# Patient Record
Sex: Male | Born: 1995 | Race: White | Hispanic: No | Smoking: Never smoker
Health system: Southern US, Community
[De-identification: ages and names within clinical notes are randomized; demographics above are authoritative.]

## PROBLEM LIST (undated history)

## (undated) DIAGNOSIS — J45909 Unspecified asthma, uncomplicated: Secondary | ICD-10-CM

---

## 2014-08-26 ENCOUNTER — Emergency Department: Payer: Self-pay | Admitting: Emergency Medicine

## 2014-08-29 LAB — BETA STREP CULTURE(ARMC)

## 2015-08-28 ENCOUNTER — Encounter: Payer: Self-pay | Admitting: Emergency Medicine

## 2015-08-28 ENCOUNTER — Emergency Department
Admission: EM | Admit: 2015-08-28 | Discharge: 2015-08-29 | Disposition: A | Discharge status: Home / Self Care | Payer: BLUE CROSS/BLUE SHIELD | Attending: Emergency Medicine | Admitting: Emergency Medicine

## 2015-08-28 ENCOUNTER — Emergency Department: Payer: BLUE CROSS/BLUE SHIELD

## 2015-08-28 DIAGNOSIS — J45901 Unspecified asthma with (acute) exacerbation: Secondary | ICD-10-CM | POA: Insufficient documentation

## 2015-08-28 DIAGNOSIS — R05 Cough: Secondary | ICD-10-CM | POA: Diagnosis present

## 2015-08-28 DIAGNOSIS — J069 Acute upper respiratory infection, unspecified: Secondary | ICD-10-CM | POA: Insufficient documentation

## 2015-08-28 HISTORY — DX: Unspecified asthma, uncomplicated: J45.909

## 2015-08-28 MED ORDER — IPRATROPIUM-ALBUTEROL 0.5-2.5 (3) MG/3ML IN SOLN
3.0000 mL | Freq: Once | RESPIRATORY_TRACT | Status: AC
  Administered 2015-08-28: 3 mL via RESPIRATORY_TRACT
  Filled 2015-08-28: qty 3

## 2015-08-28 NOTE — ED Notes (Signed)
Patient ambulatory to triage with steady gait, without difficulty or distress noted; pt reports x 2 days having nonprod cough and sinus congestion; st out of his "blue inhaler" and feels as if his asthma is "acting up"

## 2015-08-28 NOTE — ED Notes (Signed)
Pt seen and assessed by provider, see providers notes for full details.  

## 2015-08-28 NOTE — ED Provider Notes (Signed)
[facility] Emergency Department Provider Note ____________________________________________  Time seen: 2240  I have reviewed the triage vital signs and the nursing notes.  HISTORY  Chief Complaint  Cough  HPI Steven Conner is a 19 y.o. male reports to the ED for evaluation and management of 2 days of nonproductive cough. He reports sinus congestion and the same 2 days. He denies any fevers, chills, or sweats. He also is reporting some increased chest tightness due to his asthma. He's been out of his inhaler for some time.  Past Medical History  Diagnosis Date  . Asthma     There are no active problems to display for this patient.  History reviewed. No pertinent past surgical history.  Current Outpatient Rx  Name  Route  Sig  Dispense  Refill  . albuterol (PROVENTIL HFA;VENTOLIN HFA) 108 (90 BASE) MCG/ACT inhaler   Inhalation   Inhale 2 puffs into the lungs every 6 (six) hours as needed for wheezing or shortness of breath.   1 Inhaler   0   . benzonatate (TESSALON PERLES) 100 MG capsule   Oral   Take 1 capsule (100 mg total) by mouth 3 (three) times daily as needed for cough (Take 1-2 per dose).   30 capsule   0    Allergies Review of patient's allergies indicates no known allergies.  No family history on file.  Social History Social History  Substance Use Topics  . Smoking status: Never Smoker   . Smokeless tobacco: None  . Alcohol Use: No   Review of Systems  Constitutional: Negative for fever. Eyes: Negative for visual changes. ENT: Negative for sore throat. Cardiovascular: Negative for chest pain. Respiratory: Negative for shortness of breath. Reports cough Gastrointestinal: Negative for abdominal pain, vomiting and diarrhea. Genitourinary: Negative for dysuria. Musculoskeletal: Negative for back pain. Skin: Negative for rash. Neurological: Negative for headaches, focal weakness or  numbness. ____________________________________________  PHYSICAL EXAM:  VITAL SIGNS: ED Triage Vitals  Enc Vitals Group     BP 08/28/15 2223 142/88 mmHg     Pulse Rate 08/28/15 2223 100     Resp 08/28/15 2223 18     Temp 08/28/15 2223 99 F (37.2 C)     Temp Source 08/28/15 2223 Oral     SpO2 08/28/15 2223 99 %     Weight 08/28/15 2223 170 lb (77.111 kg)     Height 08/28/15 2223 5\' 10"  (1.778 m)     Head Cir --      Peak Flow --      Pain Score --      Pain Loc --      Pain Edu? --      Excl. in GC? --    Constitutional: Alert and oriented. Well appearing and in no distress. Head: Normocephalic and atraumatic.      Eyes: Conjunctivae are normal. PERRL. Normal extraocular movements      Ears: Canals clear. TMs intact bilaterally.   Nose: No congestion/rhinorrhea.   Mouth/Throat: Mucous membranes are moist.   Neck: Supple. No thyromegaly. Hematological/Lymphatic/Immunological: No cervical lymphadenopathy. Cardiovascular: Normal rate, regular rhythm.  Respiratory: Normal respiratory effort. No wheezes/rales/rhonchi. Gastrointestinal: Soft and nontender. No distention. Musculoskeletal: Nontender with normal range of motion in all extremities.  Neurologic:  Normal gait without ataxia. Normal speech and language. No gross focal neurologic deficits are appreciated. Skin:  Skin is warm, dry and intact. No rash noted. Psychiatric: Mood and affect are normal. Patient exhibits appropriate insight and judgment. ____________________________________________  RADIOLOGY CXR IMPRESSION: No active cardiopulmonary disease.  I, Dominigue Gellner, Charlesetta Ivory, personally viewed and evaluated these images (plain radiographs) as part of my medical decision making.  ____________________________________________  PROCEDURES  Duoneb x 1 ____________________________________________  INITIAL IMPRESSION / ASSESSMENT AND PLAN / ED COURSE  Mild asthma flare likely due to symptoms change  without indication of pulmonary process on x-ray. Patient's symptoms improved following the DuoNeb. He will be discharged with albuterol inhaler and Tessalon Perles for symptomatic relief. He is encouraged to hydrate and continue monitor symptoms. He'll return to the ED for acutely worsening symptoms. ____________________________________________  FINAL CLINICAL IMPRESSION(S) / ED DIAGNOSES  Final diagnoses:  URI (upper respiratory infection)  Asthma exacerbation        Lissa Hoard, PA-C 08/29/15 0009  Emily Filbert, MD 09/03/15 [phone]

## 2015-08-29 MED ORDER — ALBUTEROL SULFATE HFA 108 (90 BASE) MCG/ACT IN AERS: 2.0000 | INHALATION_SPRAY | Freq: Four times a day (QID) | RESPIRATORY_TRACT | Status: AC | PRN

## 2015-08-29 MED ORDER — BENZONATATE 100 MG PO CAPS: 100.0000 mg | ORAL_CAPSULE | Freq: Three times a day (TID) | ORAL | Status: AC | PRN

## 2015-08-29 NOTE — Discharge Instructions (Signed)
Asthma, Acute Bronchospasm °Acute bronchospasm caused by asthma is also referred to as an asthma attack. Bronchospasm means your air passages become narrowed. The narrowing is caused by inflammation and tightening of the muscles in the air tubes (bronchi) in your lungs. This can make it hard to breathe or cause you to wheeze and cough. °CAUSES °Possible triggers are: °· Animal dander from the skin, hair, or feathers of animals. °· Dust mites contained in house dust. °· Cockroaches. °· Pollen from trees or grass. °· Mold. °· Cigarette or tobacco smoke. °· Air pollutants such as dust, household cleaners, hair sprays, aerosol sprays, paint fumes, strong chemicals, or strong odors. °· Cold air or weather changes. Cold air may trigger inflammation. Winds increase molds and pollens in the air. °· Strong emotions such as crying or laughing hard. °· Stress. °· Certain medicines such as aspirin or beta-blockers. °· Sulfites in foods and drinks, such as dried fruits and wine. °· Infections or inflammatory conditions, such as a flu, cold, or inflammation of the nasal membranes (rhinitis). °· Gastroesophageal reflux disease (GERD). GERD is a condition where stomach acid backs up into your esophagus. °· Exercise or strenuous activity. °SIGNS AND SYMPTOMS  °· Wheezing. °· Excessive coughing, particularly at night. °· Chest tightness. °· Shortness of breath. °DIAGNOSIS  °Your health care provider will ask you about your medical history and perform a physical exam. A chest X-ray or blood testing may be performed to look for other causes of your symptoms or other conditions that may have triggered your asthma attack.  °TREATMENT  °Treatment is aimed at reducing inflammation and opening up the airways in your lungs.  Most asthma attacks are treated with inhaled medicines. These include quick relief or rescue medicines (such as bronchodilators) and controller medicines (such as inhaled corticosteroids). These medicines are sometimes  given through an inhaler or a nebulizer. Systemic steroid medicine taken by mouth or given through an IV tube also can be used to reduce the inflammation when an attack is moderate or severe. Antibiotic medicines are only used if a bacterial infection is present.  °HOME CARE INSTRUCTIONS  °· Rest. °· Drink plenty of liquids. This helps the mucus to remain thin and be easily coughed up. Only use caffeine in moderation and do not use alcohol until you have recovered from your illness. °· Do not smoke. Avoid being exposed to secondhand smoke. °· You play a critical role in keeping yourself in good health. Avoid exposure to things that cause you to wheeze or to have breathing problems. °· Keep your medicines up-to-date and available. Carefully follow your health care provider's treatment plan. °· Take your medicine exactly as prescribed. °· When pollen or pollution is bad, keep windows closed and use an air conditioner or go to places with air conditioning. °· Asthma requires careful medical care. See your health care provider for a follow-up as advised. If you are more than [redacted] weeks pregnant and you were prescribed any new medicines, let your obstetrician know about the visit and how you are doing. Follow up with your health care provider as directed. °· After you have recovered from your asthma attack, make an appointment with your outpatient doctor to talk about ways to reduce the likelihood of future attacks. If you do not have a doctor who manages your asthma, make an appointment with a primary care doctor to discuss your asthma. °SEEK IMMEDIATE MEDICAL CARE IF:  °· You are getting worse. °· You have trouble breathing. If severe, call your local   emergency services (911 in the [place]  You develop chest pain or discomfort.  You are vomiting.  You are not able to keep fluids down.  You are coughing up yellow, green, brown, or bloody sputum.  You have a fever and your symptoms suddenly get worse.  You have  trouble swallowing. MAKE SURE YOU:   Understand these instructions.  Will watch your condition.  Will get help right away if you are not doing well or get worse.   This information is not intended to replace advice given to you by your health care provider. Make sure you discuss any questions you have with your health care provider.   Document Released: 01/28/2007 Document Revised: 10/18/2013 Document Reviewed: 04/20/2013 Elsevier Interactive Patient Education 2016 [employer]  Upper Respiratory Infection, Adult Most upper respiratory infections (URIs) are caused by a virus. A URI affects the nose, throat, and upper air passages. The most common type of URI is often called "the common cold." HOME CARE   Take medicines only as told by your doctor.  Gargle warm saltwater or take cough drops to comfort your throat as told by your doctor.  Use a warm mist humidifier or inhale steam from a shower to increase air moisture. This may make it easier to breathe.  Drink enough fluid to keep your pee (urine) clear or pale yellow.  Eat soups and other clear broths.  Have a healthy diet.  Rest as needed.  Go back to work when your fever is gone or your doctor says it is okay.  You may need to stay home longer to avoid giving your URI to others.  You can also wear a face mask and wash your hands often to prevent spread of the virus.  Use your inhaler more if you have asthma.  Do not use any tobacco products, including cigarettes, chewing tobacco, or electronic cigarettes. If you need help quitting, ask your doctor. GET HELP IF:  You are getting worse, not better.  Your symptoms are not helped by medicine.  You have chills.  You are getting more short of breath.  You have brown or red mucus.  You have yellow or brown discharge from your nose.  You have pain in your face, especially when you bend forward.  You have a fever.  You have puffy (swollen) neck glands.  You  have pain while swallowing.  You have white areas in the back of your throat. GET HELP RIGHT AWAY IF:   You have very bad or constant:  Headache.  Ear pain.  Pain in your forehead, behind your eyes, and over your cheekbones (sinus pain).  Chest pain.  You have long-lasting (chronic) lung disease and any of the following:  Wheezing.  Long-lasting cough.  Coughing up blood.  A change in your usual mucus.  You have a stiff neck.  You have changes in your:  Vision.  Hearing.  Thinking.  Mood. MAKE SURE YOU:   Understand these instructions.  Will watch your condition.  Will get help right away if you are not doing well or get worse.   This information is not intended to replace advice given to you by your health care provider. Make sure you discuss any questions you have with your health care provider.   Document Released: 03/31/2008 Document Revised: 02/27/2015 Document Reviewed: 01/18/2014 Elsevier Interactive Patient Education 2016 [employer].  Continue to monitory symptoms. Follow-up with your provider or [facility] as needed.  Take the prescription meds as  directed. Return for worsening symptoms.

## 2015-11-12 ENCOUNTER — Emergency Department: Payer: BLUE CROSS/BLUE SHIELD

## 2015-11-12 ENCOUNTER — Encounter: Payer: Self-pay | Admitting: Emergency Medicine

## 2015-11-12 DIAGNOSIS — R079 Chest pain, unspecified: Secondary | ICD-10-CM | POA: Insufficient documentation

## 2015-11-12 LAB — CBC
HCT: 45.5 % (ref 40.0–52.0)
Hemoglobin: 15.4 g/dL (ref 13.0–18.0)
MCH: 29.2 pg (ref 26.0–34.0)
MCHC: 33.9 g/dL (ref 32.0–36.0)
MCV: 86.2 fL (ref 80.0–100.0)
PLATELETS: 217 10*3/uL (ref 150–440)
RBC: 5.28 MIL/uL (ref 4.40–5.90)
RDW: 13.4 % (ref 11.5–14.5)
WBC: 10.5 10*3/uL (ref 3.8–10.6)

## 2015-11-12 NOTE — ED Notes (Signed)
Patient ambulatory to triage with steady gait, without difficulty or distress noted; pt reports mid chest discomfort for last 1-2hrs; denies hx of same; denies accomp symptoms; st has been seen at "couple of other hospitals for a bunch of EKGs and said it was normal and went to cardiologist who said same

## 2015-11-13 ENCOUNTER — Emergency Department
Admission: EM | Admit: 2015-11-13 | Discharge: 2015-11-13 | Disposition: A | Discharge status: Home / Self Care | Payer: BLUE CROSS/BLUE SHIELD | Attending: Emergency Medicine | Admitting: Emergency Medicine

## 2015-11-13 DIAGNOSIS — R079 Chest pain, unspecified: Secondary | ICD-10-CM

## 2015-11-13 LAB — TROPONIN I: Troponin I: <0.03 ng/mL (ref <0.031)

## 2015-11-13 LAB — BASIC METABOLIC PANEL
Anion gap: 5 (ref 5–15)
BUN: 19 mg/dL (ref 6–20)
CHLORIDE: 104 mmol/L (ref 101–111)
CO2: 31 mmol/L (ref 22–32)
CREATININE: 1.02 mg/dL (ref 0.61–1.24)
Calcium: 9.6 mg/dL (ref 8.9–10.3)
GFR calc Af Amer: >60 mL/min (ref >60)
GFR calc non Af Amer: >60 mL/min (ref >60)
GLUCOSE: 111 mg/dL — AB (ref 65–99)
Potassium: 3.8 mmol/L (ref 3.5–5.1)
Sodium: 140 mmol/L (ref 135–145)

## 2015-11-13 LAB — FIBRIN DERIVATIVES D-DIMER (ARMC ONLY): Fibrin derivatives D-dimer (ARMC): 103 (ref 0–499)

## 2015-11-13 MED ORDER — SODIUM CHLORIDE 0.9 % IV BOLUS (SEPSIS)
500.0000 mL | Freq: Once | INTRAVENOUS | Status: AC
  Administered 2015-11-13: 500 mL via INTRAVENOUS

## 2015-11-13 NOTE — ED Provider Notes (Signed)
[facility] Emergency Department Provider Note  ____________________________________________  Time seen: Approximately 1:26 AM  I have reviewed the triage vital signs and the nursing notes.   HISTORY  Chief Complaint Chest Pain    HPI Steven Conner is a 20 y.o. male who presents to the ED from college dorm with a chief complaint of chest pain. Patient with a3-4 month history of palpitations, seen in the [facility] ED December 28, found to have a heart rate in the 120s which resolved with IV fluid resuscitation. He was subsequently followed up by cardiology with negative stress test and a cardiogram. Patient states he did not wear a Holter monitor at that time. States this evening he was studying at his desk and experienced central chest discomfort. Describes nonradiating aching/sharp chest discomfort not associated with diaphoresis, shortness of breath, nausea or palpitations. Patient states one of his doctors thought he had a sinus infection so he has recently finished 2 rounds of Z-Pak. States he was feeling better earlier this week until tonight when he experienced chest discomfort. Other than that he takes a steroid nose spray and PRN a beer all inhaler. He does take up protein supplements for working out and does ingest caffeine, denies any other over-the-counter or herbal medications. He does note a dry cough this week. Denies fever, chills, abdominal pain, diarrhea, dysuria, back pain, numbness, tingling. Nothing makes the symptoms better or worse.  Past Medical History  Diagnosis Date  . Asthma     There are no active problems to display for this patient.   History reviewed. No pertinent past surgical history.  Current Outpatient Rx  Name  Route  Sig  Dispense  Refill  . albuterol (PROVENTIL HFA;VENTOLIN HFA) 108 (90 BASE) MCG/ACT inhaler   Inhalation   Inhale 2 puffs into the lungs every 6 (six) hours as needed for wheezing or shortness of  breath.   1 Inhaler   0   . benzonatate (TESSALON PERLES) 100 MG capsule   Oral   Take 1 capsule (100 mg total) by mouth 3 (three) times daily as needed for cough (Take 1-2 per dose).   30 capsule   0     Allergies Review of patient's allergies indicates no known allergies.  Family history None for CAD or WPW  Social History Social History  Substance Use Topics  . Smoking status: Never Smoker   . Smokeless tobacco: None  . Alcohol Use: No  Denies illicit drug use  Review of Systems Constitutional: No fever/chills Eyes: No visual changes. ENT: No sore throat. Cardiovascular: Positive for chest pain. Respiratory: Denies shortness of breath. Gastrointestinal: No abdominal pain.  No nausea, no vomiting.  No diarrhea.  No constipation. Genitourinary: Negative for dysuria. Musculoskeletal: Negative for back pain. Skin: Negative for rash. Neurological: Negative for headaches, focal weakness or numbness.  10-point ROS otherwise negative.  ____________________________________________   PHYSICAL EXAM:  VITAL SIGNS: ED Triage Vitals  Enc Vitals Group     BP 11/12/15 2305 169/72 mmHg     Pulse Rate 11/12/15 2305 94     Resp 11/12/15 2305 18     Temp 11/12/15 2305 98.3 F (36.8 C)     Temp Source 11/12/15 2305 Oral     SpO2 11/12/15 2305 99 %     Weight 11/12/15 2305 167 lb (75.751 kg)     Height 11/12/15 2305  (1.778 m)     Head Cir --      Peak Flow --  Pain Score 11/12/15 2305 3     Pain Loc --      Pain Edu? --      Excl. in GC? --     Constitutional: Alert and oriented. Well appearing and in no acute distress. Eyes: Conjunctivae are normal. PERRL. EOMI. Head: Atraumatic. Nose: No congestion/rhinnorhea. Mouth/Throat: Mucous membranes are moist.  Oropharynx non-erythematous. Neck: No stridor.   Cardiovascular: Normal rate, regular rhythm. Grossly normal heart sounds.  Good peripheral circulation. Respiratory: Normal respiratory effort.  No  retractions. Lungs CTAB. Anterior chest wall mildly tender to palpation and patient notes pulling sensation when he stretches his right arm across his chest. Gastrointestinal: Soft and nontender. No distention. No abdominal bruits. No CVA tenderness. Musculoskeletal: No lower extremity tenderness nor edema.  No joint effusions. Neurologic:  Normal speech and language. No gross focal neurologic deficits are appreciated. No gait instability. Skin:  Skin is warm, dry and intact. No rash noted. Psychiatric: Mood and affect are normal. Speech and behavior are normal.  ____________________________________________   LABS (all labs ordered are listed, but only abnormal results are displayed)  Labs Reviewed  BASIC METABOLIC PANEL - Abnormal; Notable for the following:    Glucose, Bld 111 (*)    All other components within normal limits  CULTURE, BLOOD (ROUTINE X 2)  CULTURE, BLOOD (ROUTINE X 2)  CBC  TROPONIN I  TROPONIN I  FIBRIN DERIVATIVES D-DIMER ([facility] ONLY)  ROCKY MTN SPOTTED FVR ABS PNL(IGG+IGM)  B. BURGDORFI ANTIBODIES   ____________________________________________  EKG  ED ECG REPORT I, Izabellah Dadisman J, the attending physician, personally viewed and interpreted this ECG.   Date: 11/13/2015  EKG Time: 2309  Rate: 84  Rhythm: normal EKG, normal sinus rhythm  Axis: RAD  Intervals:right bundle branch block  ST&T Change: Nonspecific Similar sounding appearance compared to EKG reports dated 10/24/2015  ____________________________________________  RADIOLOGY  Chest 2 view (viewed by me, interpreted per Dr. Gwenyth Bender): No active cardiopulmonary disease. ____________________________________________   PROCEDURES  Procedure(s) performed: None  Critical Care performed: No  ____________________________________________   INITIAL IMPRESSION / ASSESSMENT AND PLAN / ED COURSE  Pertinent labs & imaging results that were available during my care of the patient were reviewed by me  and considered in my medical decision making (see chart for details).  20 year old male who presents with a 3-4 month history of palpitations, s/p cardiology workup, now with chest discomfort. Initial labwork reassuring; initial troponin negative. 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 Patient's mother wants him to be tested for "staph infection" as well as Lyme disease. Patient is currently resting in no acute distress, voicing no complaints of chest pain.  ----------------------------------------- 3:49 AM on 11/13/2015 -----------------------------------------  Repeat troponin and d-dimer are negative. Plan for outpatient cardiology follow-up. Blood cultures and Lyme/RMSF panels are pending. I have advised patient to hold off on taking protein supplements and decrease his caffeine intake until seen by cardiology. Strict return precautions given. Patient verbalizes understanding and agrees with plan of care. ____________________________________________   FINAL CLINICAL IMPRESSION(S) / ED DIAGNOSES  Final diagnoses:  Chest pain, unspecified chest pain type      Irean Hong, MD 11/13/15 [phone]

## 2015-11-13 NOTE — ED Notes (Signed)

## 2015-11-13 NOTE — Discharge Instructions (Signed)
1. Avoid supplements and decrease caffeine intake until seen by the cardiologist. 2. Blood cultures and Lyme/RMSF blood work are pending. You will be notified of any positive results. 3. Return to the ER for worsening symptoms, persistent vomiting, difficulty breathing or other concerns.  Nonspecific Chest Pain  Chest pain can be caused by many different conditions. There is always a chance that your pain could be related to something serious, such as a heart attack or a blood clot in your lungs. Chest pain can also be caused by conditions that are not life-threatening. If you have chest pain, it is very important to follow up with your health care provider. CAUSES  Chest pain can be caused by:  Heartburn.  Pneumonia or bronchitis.  Anxiety or stress.  Inflammation around your heart (pericarditis) or lung (pleuritis or pleurisy).  A blood clot in your lung.  A collapsed lung (pneumothorax). It can develop suddenly on its own (spontaneous pneumothorax) or from trauma to the chest.  Shingles infection (varicella-zoster virus).  Heart attack.  Damage to the bones, muscles, and cartilage that make up your chest wall. This can include:  Bruised bones due to injury.  Strained muscles or cartilage due to frequent or repeated coughing or overwork.  Fracture to one or more ribs.  Sore cartilage due to inflammation (costochondritis). RISK FACTORS  Risk factors for chest pain may include:  Activities that increase your risk for trauma or injury to your chest.  Respiratory infections or conditions that cause frequent coughing.  Medical conditions or overeating that can cause heartburn.  Heart disease or family history of heart disease.  Conditions or health behaviors that increase your risk of developing a blood clot.  Having had chicken pox (varicella zoster). SIGNS AND SYMPTOMS Chest pain can feel like:  Burning or tingling on the surface of your chest or deep in your  chest.  Crushing, pressure, aching, or squeezing pain.  Dull or sharp pain that is worse when you move, cough, or take a deep breath.  Pain that is also felt in your back, neck, shoulder, or arm, or pain that spreads to any of these areas. Your chest pain may come and go, or it may stay constant. DIAGNOSIS Lab tests or other studies may be needed to find the cause of your pain. Your health care provider may have you take a test called an ambulatory ECG (electrocardiogram). An ECG records your heartbeat patterns at the time the test is performed. You may also have other tests, such as:  Transthoracic echocardiogram (TTE). During echocardiography, sound waves are used to create a picture of all of the heart structures and to look at how blood flows through your heart.  Transesophageal echocardiogram (TEE).This is a more advanced imaging test that obtains images from inside your body. It allows your health care provider to see your heart in finer detail.  Cardiac monitoring. This allows your health care provider to monitor your heart rate and rhythm in real time.  Holter monitor. This is a portable device that records your heartbeat and can help to diagnose abnormal heartbeats. It allows your health care provider to track your heart activity for several days, if needed.  Stress tests. These can be done through exercise or by taking medicine that makes your heart beat more quickly.  Blood tests.  Imaging tests. TREATMENT  Your treatment depends on what is causing your chest pain. Treatment may include:  Medicines. These may include:  Acid blockers for heartburn.  Anti-inflammatory medicine.  Pain medicine for inflammatory conditions.  Antibiotic medicine, if an infection is present.  Medicines to dissolve blood clots.  Medicines to treat coronary artery disease.  Supportive care for conditions that do not require medicines. This may include:  Resting.  Applying heat or cold  packs to injured areas.  Limiting activities until pain decreases. HOME CARE INSTRUCTIONS  If you were prescribed an antibiotic medicine, finish it all even if you start to feel better.  Avoid any activities that bring on chest pain.  Do not use any tobacco products, including cigarettes, chewing tobacco, or electronic cigarettes. If you need help quitting, ask your health care provider.  Do not drink alcohol.  Take medicines only as directed by your health care provider.  Keep all follow-up visits as directed by your health care provider. This is important. This includes any further testing if your chest pain does not go away.  If heartburn is the cause for your chest pain, you may be told to keep your head raised (elevated) while sleeping. This reduces the chance that acid will go from your stomach into your esophagus.  Make lifestyle changes as directed by your health care provider. These may include:  Getting regular exercise. Ask your health care provider to suggest some activities that are safe for you.  Eating a heart-healthy diet. A registered dietitian can help you to learn healthy eating options.  Maintaining a healthy weight.  Managing diabetes, if necessary.  Reducing stress. SEEK MEDICAL CARE IF:  Your chest pain does not go away after treatment.  You have a rash with blisters on your chest.  You have a fever. SEEK IMMEDIATE MEDICAL CARE IF:   Your chest pain is worse.  You have an increasing cough, or you cough up blood.  You have severe abdominal pain.  You have severe weakness.  You faint.  You have chills.  You have sudden, unexplained chest discomfort.  You have sudden, unexplained discomfort in your arms, back, neck, or jaw.  You have shortness of breath at any time.  You suddenly start to sweat, or your skin gets clammy.  You feel nauseous or you vomit.  You suddenly feel light-headed or dizzy.  Your heart begins to beat quickly, or  it feels like it is skipping beats. These symptoms may represent a serious problem that is an emergency. Do not wait to see if the symptoms will go away. Get medical help right away. Call your local emergency services (911 in the [place] Do not drive yourself to the hospital.   This information is not intended to replace advice given to you by your health care provider. Make sure you discuss any questions you have with your health care provider.   Document Released: 07/23/2005 Document Revised: 11/03/2014 Document Reviewed: 05/19/2014 [employer]vier Interactive Patient Education [employer].

## 2015-11-14 LAB — ROCKY MTN SPOTTED FVR ABS PNL(IGG+IGM)
RMSF IgG: NEGATIVE
RMSF IgM: 0.24 index (ref 0.00–0.89)

## 2015-11-14 LAB — B. BURGDORFI ANTIBODIES

## 2015-11-18 LAB — CULTURE, BLOOD (ROUTINE X 2)
CULTURE: NO GROWTH
CULTURE: NO GROWTH

## 2017-02-04 ENCOUNTER — Ambulatory Visit
Admission: RE | Admit: 2017-02-04 | Discharge: 2017-02-04 | Disposition: A | Discharge status: Home / Self Care | Payer: BLUE CROSS/BLUE SHIELD | Source: Ambulatory Visit | Attending: Internal Medicine | Admitting: Internal Medicine

## 2017-02-04 ENCOUNTER — Other Ambulatory Visit: Payer: Self-pay | Admitting: Internal Medicine

## 2017-02-04 DIAGNOSIS — N50819 Testicular pain, unspecified: Secondary | ICD-10-CM

## 2017-02-04 DIAGNOSIS — E299 Testicular dysfunction, unspecified: Secondary | ICD-10-CM | POA: Insufficient documentation

## 2018-09-24 IMAGING — US US ART/VEN ABD/PELV/SCROTUM DOPPLER LTD
1 series · 14 of 25 positions shown · non-contrast
Comparison: None.

CLINICAL DATA: Testicular pain. Testicular pain, RIGHT greater than
LEFT for 4 days. Worsening. No known injury or surgery.

EXAM:
SCROTAL ULTRASOUND
DOPPLER ULTRASOUND OF THE TESTICLES
TECHNIQUE: Complete ultrasound examination of the testicles, epididymis, and
other scrotal structures was performed. Color and spectral Doppler
ultrasound were also utilized to evaluate blood flow to the
testicles.

[Series 1: us art/ven abd/pelv/scrotum doppler ltd · 0.08mm/px · 14 of 78 slices shown]
[im 1/78]
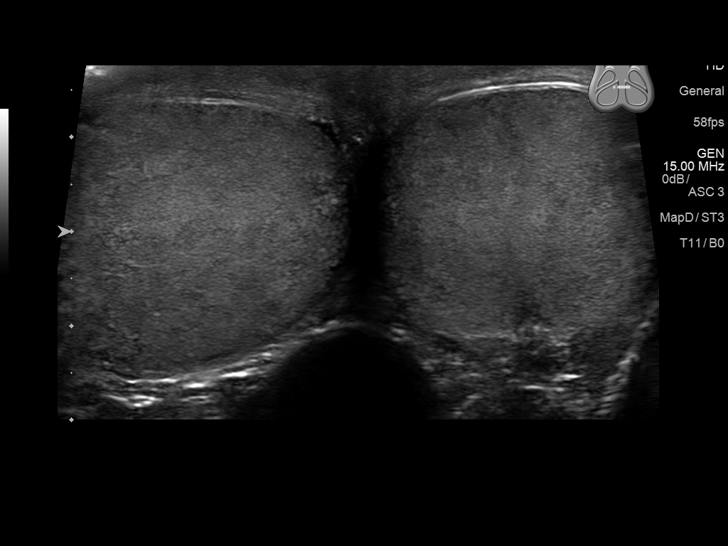
[im 7/78]
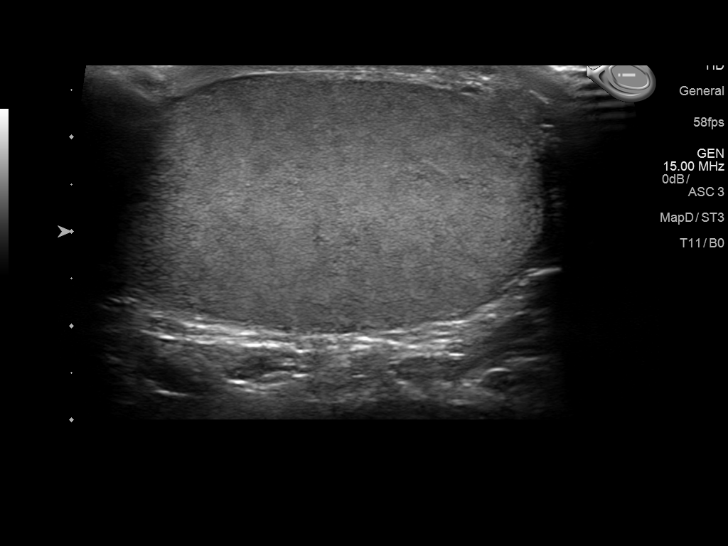
[im 13/78]
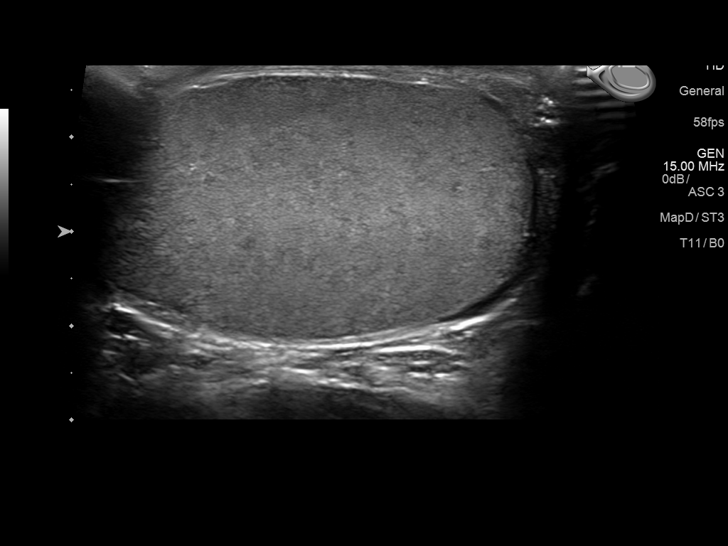
[im 20/78]
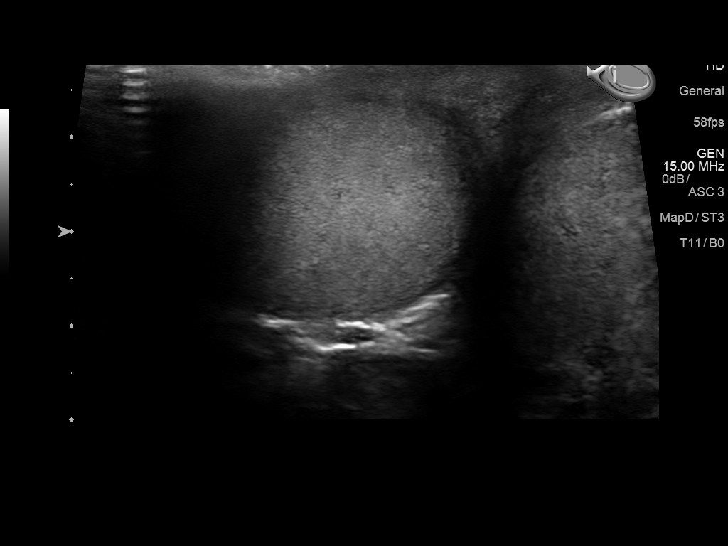
[im 26/78]
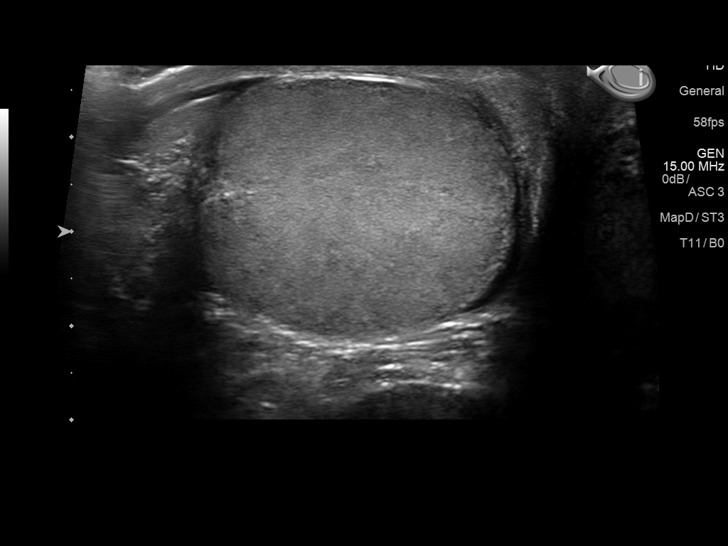
[im 29/78]
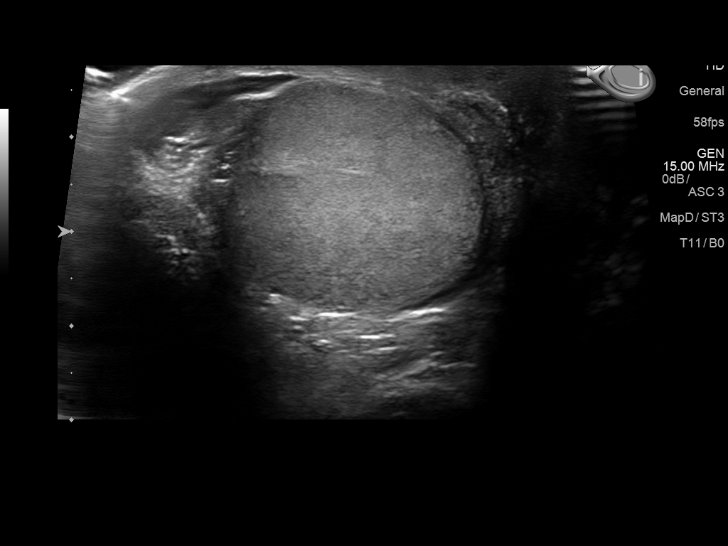
[im 36/78]
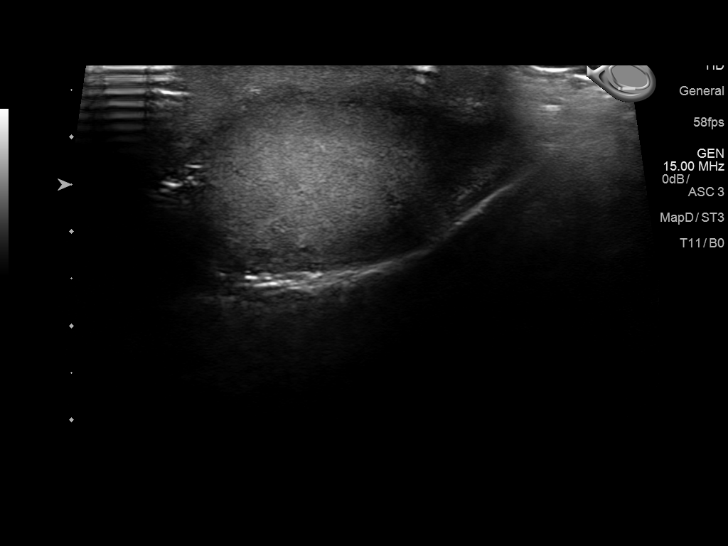
[im 42/78]
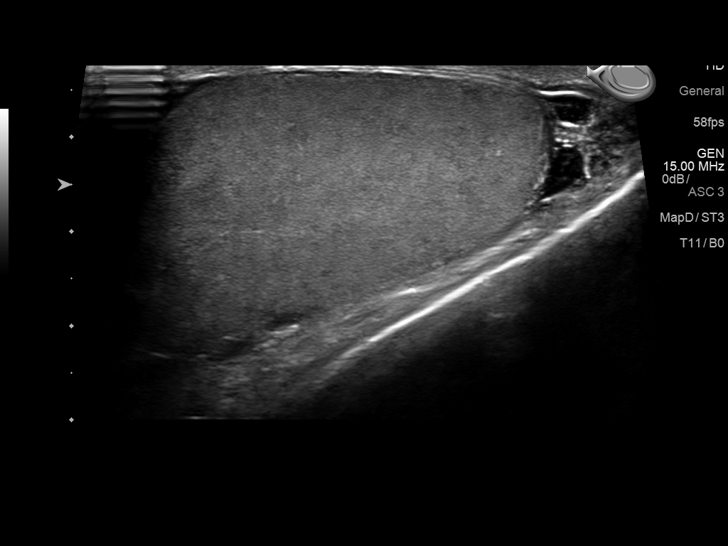
[im 49/78]
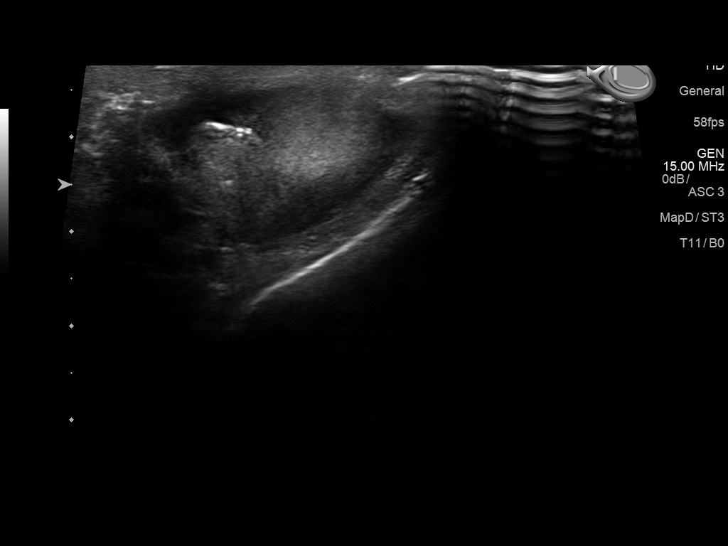
[im 52/78]
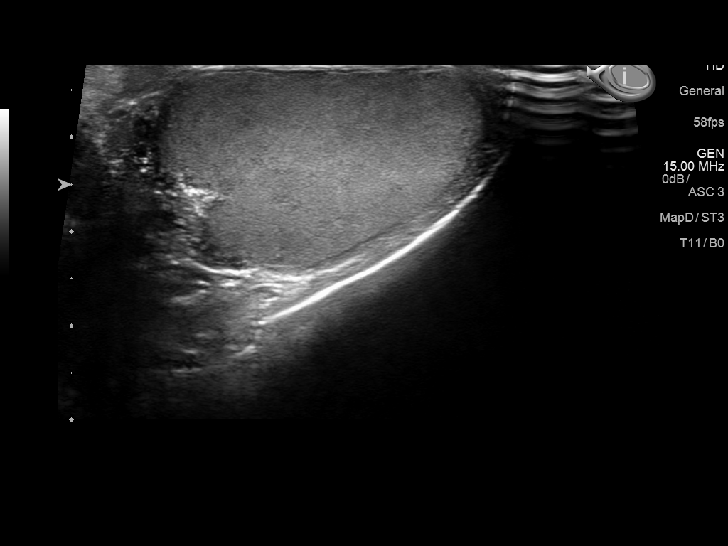
[im 58/78]
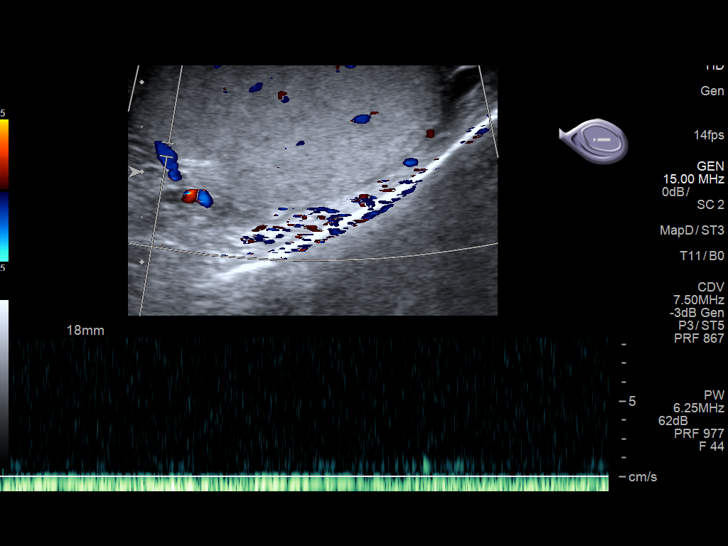
[im 65/78]
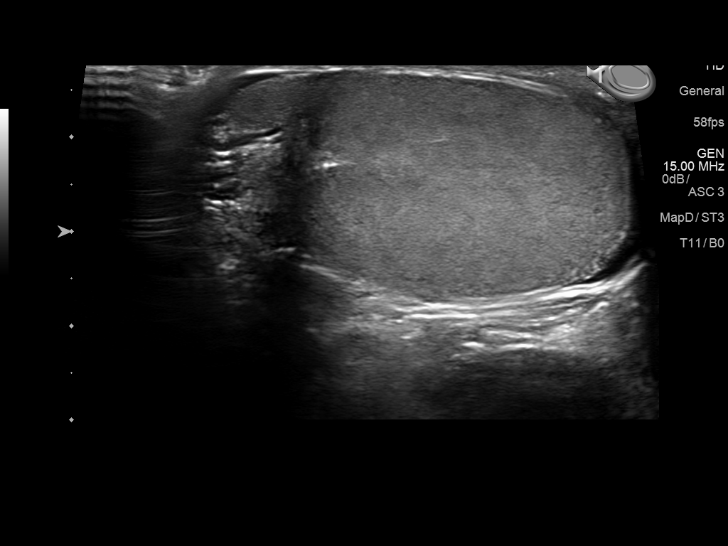
[im 71/78]
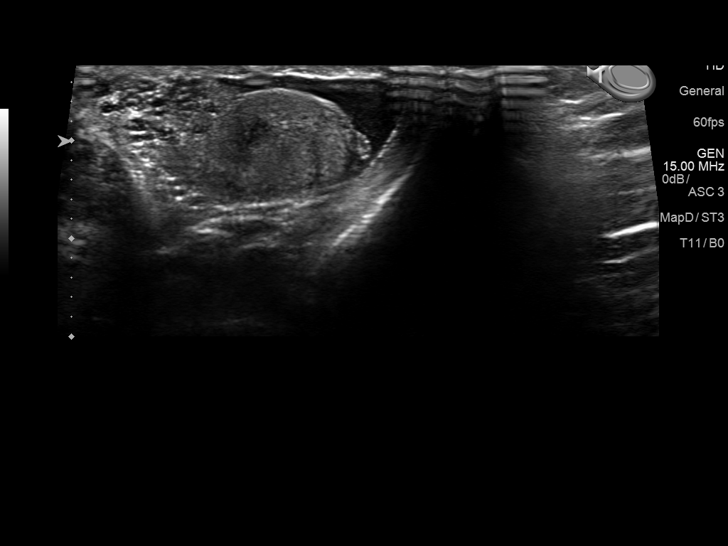
[im 78/78]
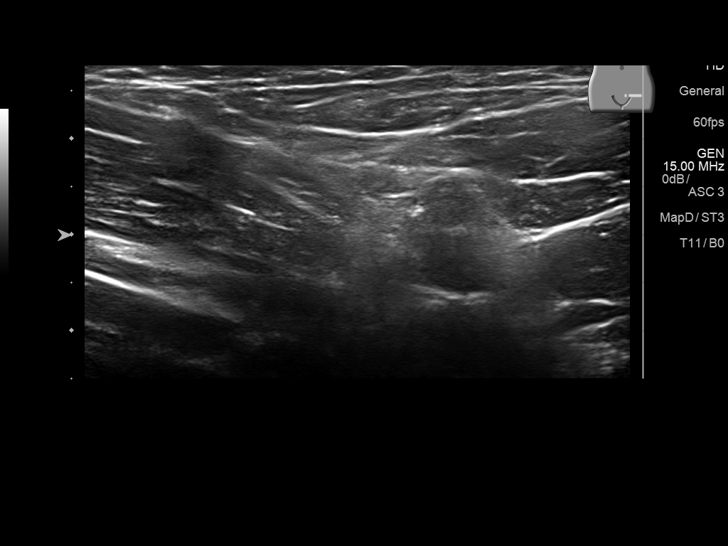

[14 of 25 positions shown; findings below may reference images not displayed]

FINDINGS: Right testicle

Measurements: Normal in size and homogeneous in texture measuring
4.3 x 2.7 x 3.1 cm.. Normal color Doppler flow and spectral
waveforms.

Left testicle

Measurements: Normal in size and homogeneous in echotexture
measuring 3.2 x 1.9 x 3.1 cm.. Normal color Doppler flow and
spectral waveforms.

Right epididymis:  Normal in size and appearance.

Left epididymis:  Normal in size and appearance.

Hydrocele:  Small LEFT hydrocele.

Varicocele:  None visualized.

Pulsed Doppler interrogation of both testes demonstrates normal low
resistance arterial and venous waveforms bilaterally.
IMPRESSION: 1. Normal testicles.
2. No evidence for torsion.
3. Trace fluid in the LEFT scrotum.
